# Patient Record
Sex: Male | Born: 1970 | Race: White | Hispanic: No | Marital: Single | Smoking: Never smoker
Health system: Southern US, Community
[De-identification: ages and names within clinical notes are randomized; demographics above are authoritative.]

---

## 1992-10-05 HISTORY — PX: ANKLE SURGERY: SHX546

## 2009-10-05 HISTORY — PX: KNEE SURGERY: SHX244

## 2012-09-06 ENCOUNTER — Encounter: Payer: Self-pay | Admitting: Cardiovascular Disease

## 2012-09-06 ENCOUNTER — Ambulatory Visit (INDEPENDENT_AMBULATORY_CARE_PROVIDER_SITE_OTHER): Payer: Self-pay | Admitting: Cardiovascular Disease

## 2012-09-06 VITALS — BP 102/64 | HR 49 | Ht 76.0 in | Wt 195.0 lb

## 2012-09-06 DIAGNOSIS — R002 Palpitations: Secondary | ICD-10-CM | POA: Insufficient documentation

## 2012-09-06 DIAGNOSIS — Z8249 Family history of ischemic heart disease and other diseases of the circulatory system: Secondary | ICD-10-CM

## 2012-09-06 NOTE — Patient Instructions (Addendum)
**Note De-identified Kaspian Muccio Obfuscation** Your physician recommends that you continue on your current medications as directed. Please refer to the Current Medication list given to you today.  Your physician wants you to follow-up in: 2 years  You will receive a reminder letter in the mail two months in advance. If you don't receive a letter, please call our office to schedule the follow-up appointment.  

## 2012-09-06 NOTE — Progress Notes (Signed)
HPI:  41 year old gentleman presenting for initial cardiac evaluation. The patient has had a few episodes of palpitations, but no sustained symptoms. He has noticed this when he's been engaged in exercise. He denies any episodes of lightheadedness, near syncope, or frank syncope. He's had no chest pain or pressure, dyspnea, edema, orthopnea, or PND. He has been in very good health over the years and was a Optometrist in college. He doesn't exercise as much as in the past because of orthopedic problems. He stays active with his teenage boys.  He has a family history of premature CAD on his father's side of the family. His paternal grandfather and paternal uncles all had coronary disease in their 38s and 57s. His mother side of the family has had atrial fibrillation.  The patient has no personal history of hypertension, diabetes, dyslipidemia, or tobacco use.  No outpatient encounter prescriptions on file as of 09/06/2012.    Review of patient's allergies indicates not on file.  History reviewed. No pertinent past medical history.  Past Surgical History  Procedure Date  . Ankle surgery 1994     On both Ankles due sports activities  . Knee surgery 2011    Left knee surgery due to sports activities    History   Social History  . Marital Status: Single    Spouse Name: N/A    Number of Children: N/A  . Years of Education: N/A   Occupational History  . Not on file.   Social History Main Topics  . Smoking status: Never Smoker   . Smokeless tobacco: Not on file  . Alcohol Use: 0.6 oz/week    1 Glasses of wine per week  . Drug Use: No  . Sexually Active: Yes   Other Topics Concern  . Not on file   Social History Narrative  . No narrative on file    Family History  Problem Relation Age of Onset  . Atrial fibrillation Mother   . Lung cancer Father   . Hyperlipidemia Brother   . Hypertension Maternal Aunt   . Hypertension Maternal Uncle   . Heart disease Paternal  Uncle   . Heart attack Paternal Grandfather     ROS:  General: no fevers/chills/night sweats Eyes: no blurry vision, diplopia, or amaurosis ENT: no sore throat or hearing loss Resp: no cough, wheezing, or hemoptysis CV: no edema or palpitations GI: no abdominal pain, nausea, vomiting, diarrhea, or constipation GU: no dysuria, frequency, or hematuria Skin: no rash Neuro: no headache, numbness, tingling, or weakness of extremities Musculoskeletal: Positive for chronic left knee pain Heme: no bleeding, DVT, or easy bruising Endo: no polydipsia or polyuria  BP 102/64  Pulse 49  Ht 6\' 4"  (1.93 m)  Wt 88.451 kg (195 lb)  BMI 23.74 kg/m2  SpO2 98%  PHYSICAL EXAM: Pt is alert and oriented, WD, WN, in no distress. HEENT: normal Neck: JVP normal. Carotid upstrokes normal without bruits. No thyromegaly. Lungs: equal expansion, clear bilaterally CV: Apex is discrete and nondisplaced, RRR without murmur or gallop Abd: soft, NT, +BS, no bruit, no hepatosplenomegaly Back: no CVA tenderness Ext: no C/C/E        DP/PT pulses intact and = Skin: warm and dry without rash Neuro: CNII-XII intact             Strength intact = bilaterally  EKG:  Marked sinus bradycardia 49 beats per minute, otherwise within normal limits.  ASSESSMENT AND PLAN: 1. Palpitations. I don't see any concerning findings  here. The patient's resting bradycardia is likely related to high vagal tone from good physical condition. He's had rare episodes of palpitations and will notify me if they increase in frequency or severity. His physical exam is normal and there is no suggestion of structural heart disease.  2. Family history of CAD. There are no other associated risk factors present. A cholesterol panel will be done in the next few months by Dr. Waynard Edwards. I'll request a copy of those labs as well. The patient informs me that his cholesterol panel has been excellent in the past. We discussed consideration of screening for  subclinical atherosclerosis, but will defer for now.  For followup I would like to see Ulysees back in about 2 years unless problems arise in the interim.  Tonny Bollman 09/06/2012 2:13 PM

## 2012-09-27 NOTE — Addendum Note (Signed)
Addended by: Burnett Kanaris A on: 09/27/2012 11:20 AM   Modules accepted: Orders

## 2016-01-22 DIAGNOSIS — Z23 Encounter for immunization: Secondary | ICD-10-CM | POA: Diagnosis not present

## 2016-09-29 DIAGNOSIS — R05 Cough: Secondary | ICD-10-CM | POA: Diagnosis not present

## 2017-10-27 DIAGNOSIS — H52223 Regular astigmatism, bilateral: Secondary | ICD-10-CM | POA: Diagnosis not present

## 2017-12-22 DIAGNOSIS — Z0000 Encounter for general adult medical examination without abnormal findings: Secondary | ICD-10-CM | POA: Diagnosis not present

## 2017-12-22 DIAGNOSIS — Z125 Encounter for screening for malignant neoplasm of prostate: Secondary | ICD-10-CM | POA: Diagnosis not present

## 2017-12-24 DIAGNOSIS — Z0000 Encounter for general adult medical examination without abnormal findings: Secondary | ICD-10-CM | POA: Diagnosis not present

## 2017-12-24 DIAGNOSIS — M546 Pain in thoracic spine: Secondary | ICD-10-CM | POA: Diagnosis not present

## 2017-12-24 DIAGNOSIS — R002 Palpitations: Secondary | ICD-10-CM | POA: Diagnosis not present

## 2017-12-24 DIAGNOSIS — Z1389 Encounter for screening for other disorder: Secondary | ICD-10-CM | POA: Diagnosis not present

## 2017-12-24 DIAGNOSIS — I4510 Unspecified right bundle-branch block: Secondary | ICD-10-CM | POA: Diagnosis not present

## 2017-12-24 DIAGNOSIS — Z23 Encounter for immunization: Secondary | ICD-10-CM | POA: Diagnosis not present

## 2018-02-08 DIAGNOSIS — M25511 Pain in right shoulder: Secondary | ICD-10-CM | POA: Diagnosis not present

## 2018-08-03 DIAGNOSIS — M79671 Pain in right foot: Secondary | ICD-10-CM | POA: Diagnosis not present

## 2018-08-03 DIAGNOSIS — M21611 Bunion of right foot: Secondary | ICD-10-CM | POA: Diagnosis not present

## 2019-03-21 DIAGNOSIS — M479 Spondylosis, unspecified: Secondary | ICD-10-CM | POA: Diagnosis not present

## 2019-03-21 DIAGNOSIS — M545 Low back pain: Secondary | ICD-10-CM | POA: Diagnosis not present

## 2019-06-06 DIAGNOSIS — M546 Pain in thoracic spine: Secondary | ICD-10-CM | POA: Diagnosis not present

## 2019-06-15 DIAGNOSIS — M546 Pain in thoracic spine: Secondary | ICD-10-CM | POA: Diagnosis not present

## 2019-06-23 DIAGNOSIS — M546 Pain in thoracic spine: Secondary | ICD-10-CM | POA: Diagnosis not present

## 2019-06-30 DIAGNOSIS — M546 Pain in thoracic spine: Secondary | ICD-10-CM | POA: Diagnosis not present

## 2019-07-11 DIAGNOSIS — M546 Pain in thoracic spine: Secondary | ICD-10-CM | POA: Diagnosis not present

## 2019-11-21 DIAGNOSIS — Z03818 Encounter for observation for suspected exposure to other biological agents ruled out: Secondary | ICD-10-CM | POA: Diagnosis not present

## 2019-11-21 DIAGNOSIS — Z20828 Contact with and (suspected) exposure to other viral communicable diseases: Secondary | ICD-10-CM | POA: Diagnosis not present

## 2019-12-28 ENCOUNTER — Ambulatory Visit: Payer: Self-pay | Attending: Internal Medicine

## 2019-12-28 DIAGNOSIS — Z23 Encounter for immunization: Secondary | ICD-10-CM

## 2019-12-28 NOTE — Progress Notes (Signed)
   Covid-19 Vaccination Clinic  Name:  Geoffrey Sullivan    MRN: [id number] DOB: 19-Dec-1970  12/28/2019  [name] was observed post Covid-19 immunization for 15 minutes without incident. He was provided with Vaccine Information Sheet and instruction to access the V-Safe system.   [name] was instructed to call 911 with any severe reactions post vaccine: Marland Kitchen Difficulty breathing  . Swelling of face and throat  . A fast heartbeat  . A bad rash all over body  . Dizziness and weakness   Immunizations Administered    Name Date Dose VIS Date Route   Pfizer COVID-19 Vaccine 12/28/2019  1:36 PM 0.3 mL 09/15/2019 Intramuscular   Manufacturer: [employer], [employer]   Lot: [id number]   NDC: [address]-6

## 2020-01-23 ENCOUNTER — Ambulatory Visit: Payer: Self-pay | Attending: Internal Medicine

## 2020-01-23 DIAGNOSIS — Z23 Encounter for immunization: Secondary | ICD-10-CM

## 2020-01-23 NOTE — Progress Notes (Signed)
   Covid-19 Vaccination Clinic  Name:  FIRMAN PETROW    MRN: [id number] DOB: 1971/05/30  01/23/2020  Mr. Kudrna was observed post Covid-19 immunization for 15 minutes without incident. He was provided with Vaccine Information Sheet and instruction to access the V-Safe system.   Mr. Gunnoe was instructed to call 911 with any severe reactions post vaccine: Marland Kitchen Difficulty breathing  . Swelling of face and throat  . A fast heartbeat  . A bad rash all over body  . Dizziness and weakness   Immunizations Administered    Name Date Dose VIS Date Route   Pfizer COVID-19 Vaccine 01/23/2020 10:04 AM 0.3 mL 11/29/2018 Intramuscular   Manufacturer: [employer], [employer]   Lot: [id number]   NDC: [address]-1

## 2020-01-24 ENCOUNTER — Ambulatory Visit: Payer: Self-pay

## 2020-05-28 DIAGNOSIS — Z0000 Encounter for general adult medical examination without abnormal findings: Secondary | ICD-10-CM | POA: Diagnosis not present

## 2020-05-28 DIAGNOSIS — Z125 Encounter for screening for malignant neoplasm of prostate: Secondary | ICD-10-CM | POA: Diagnosis not present

## 2020-05-29 DIAGNOSIS — Z0000 Encounter for general adult medical examination without abnormal findings: Secondary | ICD-10-CM | POA: Diagnosis not present

## 2020-05-29 DIAGNOSIS — E785 Hyperlipidemia, unspecified: Secondary | ICD-10-CM | POA: Diagnosis not present

## 2020-05-29 DIAGNOSIS — M546 Pain in thoracic spine: Secondary | ICD-10-CM | POA: Diagnosis not present

## 2020-05-29 DIAGNOSIS — Z23 Encounter for immunization: Secondary | ICD-10-CM | POA: Diagnosis not present

## 2020-06-11 DIAGNOSIS — Z20822 Contact with and (suspected) exposure to covid-19: Secondary | ICD-10-CM | POA: Diagnosis not present

## 2021-05-22 DIAGNOSIS — M25562 Pain in left knee: Secondary | ICD-10-CM | POA: Diagnosis not present

## 2021-05-22 DIAGNOSIS — M25561 Pain in right knee: Secondary | ICD-10-CM | POA: Diagnosis not present

## 2021-11-27 DIAGNOSIS — M79671 Pain in right foot: Secondary | ICD-10-CM | POA: Diagnosis not present

## 2021-12-23 DIAGNOSIS — E785 Hyperlipidemia, unspecified: Secondary | ICD-10-CM | POA: Diagnosis not present

## 2021-12-23 DIAGNOSIS — Z125 Encounter for screening for malignant neoplasm of prostate: Secondary | ICD-10-CM | POA: Diagnosis not present

## 2021-12-30 DIAGNOSIS — Z1331 Encounter for screening for depression: Secondary | ICD-10-CM | POA: Diagnosis not present

## 2021-12-30 DIAGNOSIS — Z1212 Encounter for screening for malignant neoplasm of rectum: Secondary | ICD-10-CM | POA: Diagnosis not present

## 2021-12-30 DIAGNOSIS — R82998 Other abnormal findings in urine: Secondary | ICD-10-CM | POA: Diagnosis not present

## 2021-12-30 DIAGNOSIS — Z0000 Encounter for general adult medical examination without abnormal findings: Secondary | ICD-10-CM | POA: Diagnosis not present

## 2021-12-30 DIAGNOSIS — Z1389 Encounter for screening for other disorder: Secondary | ICD-10-CM | POA: Diagnosis not present

## 2021-12-31 ENCOUNTER — Other Ambulatory Visit: Payer: Self-pay | Admitting: Internal Medicine

## 2021-12-31 DIAGNOSIS — E785 Hyperlipidemia, unspecified: Secondary | ICD-10-CM

## 2022-02-18 ENCOUNTER — Ambulatory Visit
Admission: RE | Admit: 2022-02-18 | Discharge: 2022-02-18 | Disposition: A | Discharge status: Home / Self Care | Payer: Self-pay | Source: Ambulatory Visit | Attending: Internal Medicine | Admitting: Internal Medicine

## 2022-02-18 DIAGNOSIS — E785 Hyperlipidemia, unspecified: Secondary | ICD-10-CM

## 2022-07-13 DIAGNOSIS — D2262 Melanocytic nevi of left upper limb, including shoulder: Secondary | ICD-10-CM | POA: Diagnosis not present

## 2022-07-13 DIAGNOSIS — D2261 Melanocytic nevi of right upper limb, including shoulder: Secondary | ICD-10-CM | POA: Diagnosis not present

## 2022-07-13 DIAGNOSIS — D225 Melanocytic nevi of trunk: Secondary | ICD-10-CM | POA: Diagnosis not present

## 2022-07-13 DIAGNOSIS — D2271 Melanocytic nevi of right lower limb, including hip: Secondary | ICD-10-CM | POA: Diagnosis not present

## 2023-05-23 IMAGING — CT CT CARDIAC CORONARY ARTERY CALCIUM SCORE
3 series · 12 of 20 positions shown, 14 images · non-contrast
Comparison: None Available.

CLINICAL DATA: Hyperlipidemia

EXAM:
CT CARDIAC CORONARY ARTERY CALCIUM SCORE
TECHNIQUE: Non-contrast imaging through the heart was performed using
prospective ECG gating. Image post processing was performed on an
independent workstation, allowing for quantitative analysis of the
heart and coronary arteries. Note that this exam targets the heart
and the chest was not imaged in its entirety.

[Series 2: calcium scoring 2.00 qr36 bestdiast 66% hrt calciu · axial · 0.42mm/px · z∈[+1545,+1585]mm · 2 of 100 slices shown]
[im 20/100  vessel]
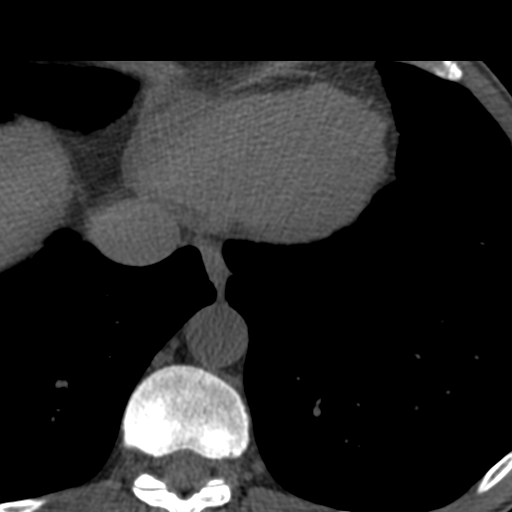
[im 40/100  vessel]
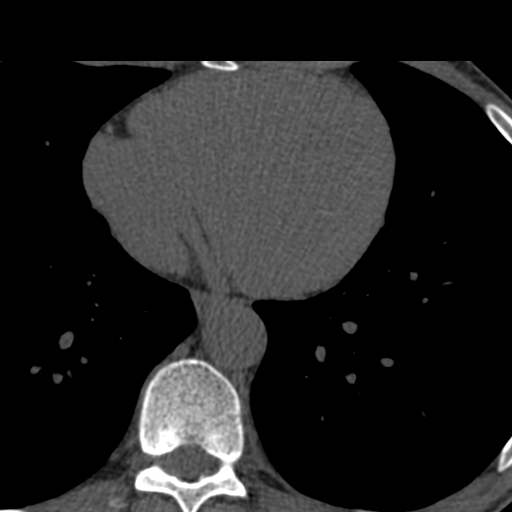

[Series 3: calcium scoring 2.00 br40 bestdiast 66% axial · axial · 0.53mm/px · z∈[+1541,+1671]mm · 5 of 99 slices shown, 7 images]
[im 17/99  vessel]
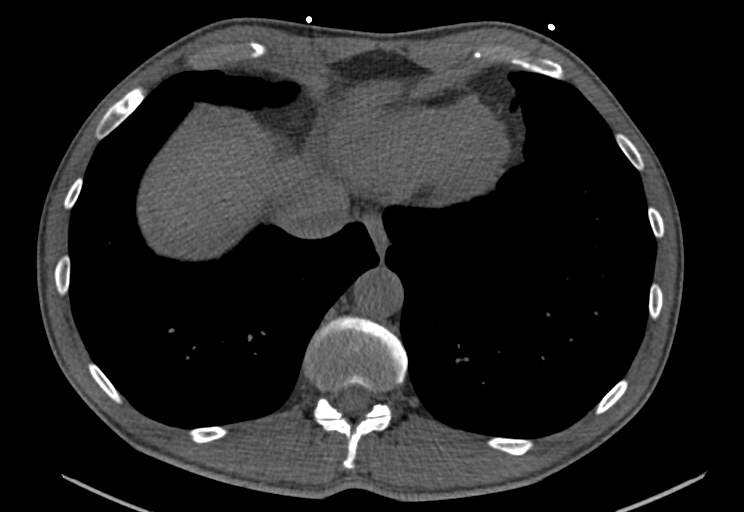
[im 17/99  lung]
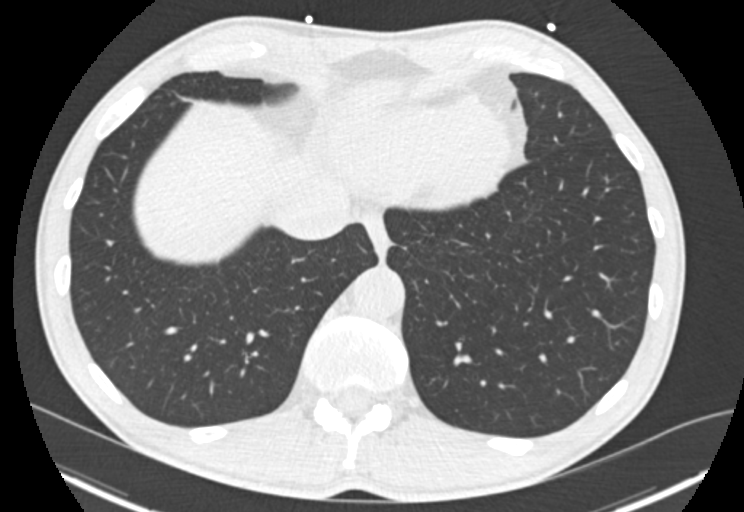
[im 33/99  vessel]
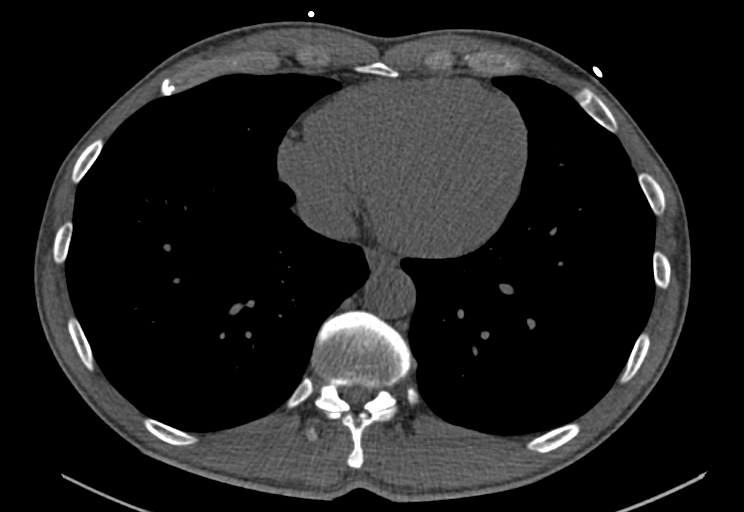
[im 50/99  vessel]
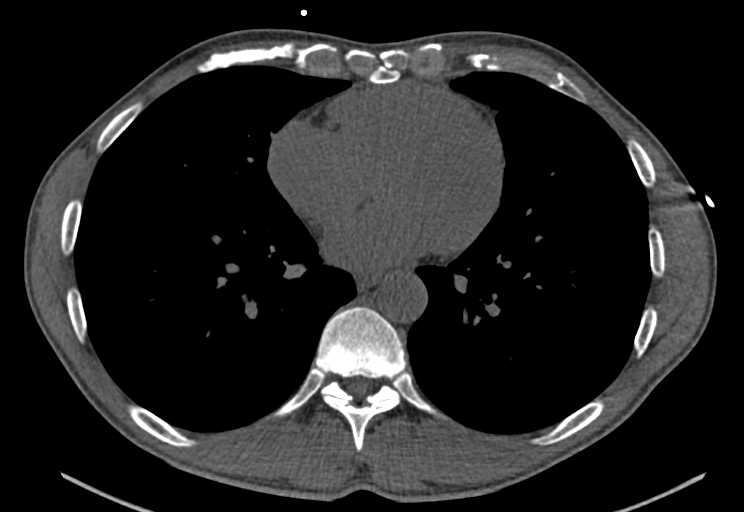
[im 66/99  vessel]
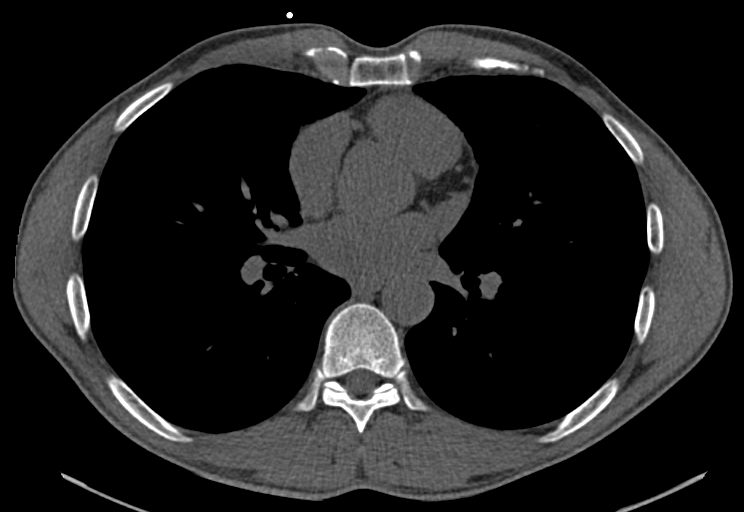
[im 82/99  vessel]
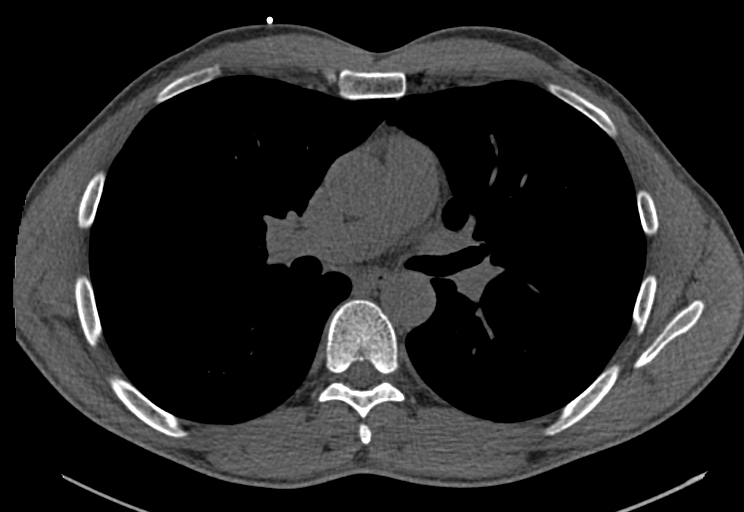
[im 82/99  lung]
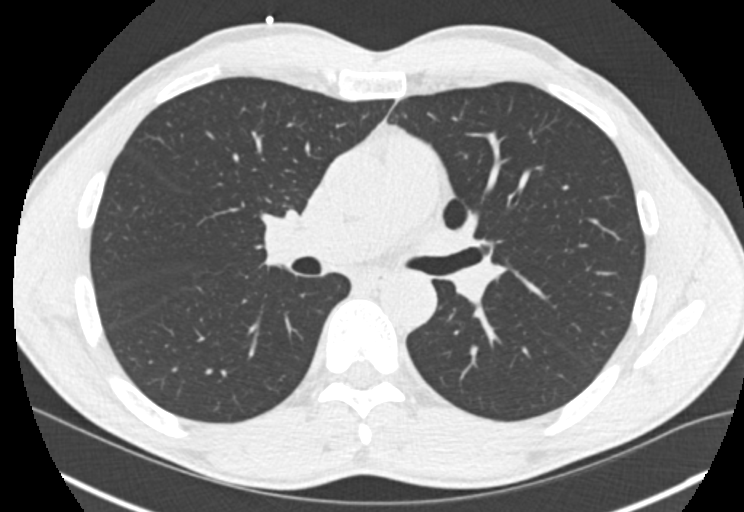

[Series 9: calcium scoring 2.00 br60 bestdiast 66% lungs · axial · 0.53mm/px · z∈[+1541,+1671]mm · 5 of 99 slices shown]
[im 17/99  vessel]
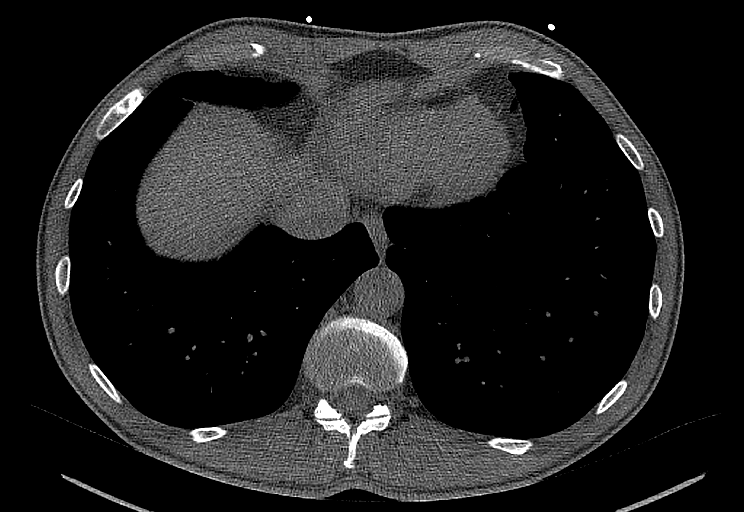
[im 33/99  vessel]
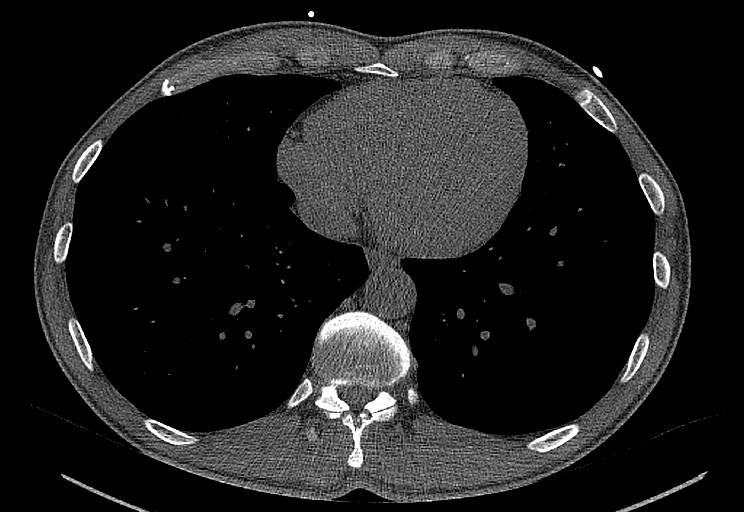
[im 50/99  vessel]
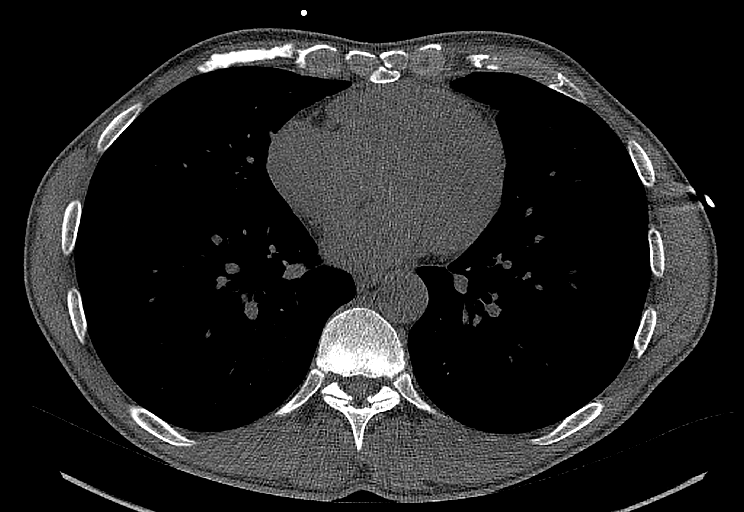
[im 66/99  vessel]
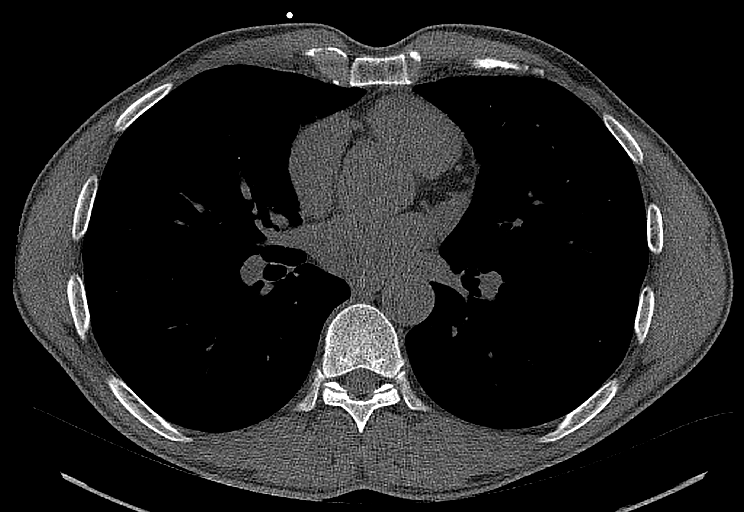
[im 82/99  vessel]
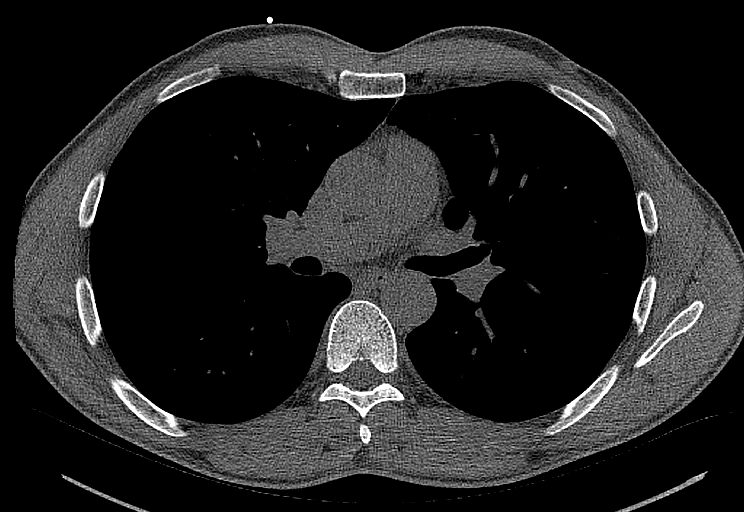

[12 of 20 positions shown; findings below may reference images not displayed]

FINDINGS: CORONARY CALCIUM SCORES:

Left Main: 0

LAD: 0

LCx: 0

RCA: 0

Total Agatston Score: 0

[HOSPITAL] percentile: 0

AORTA MEASUREMENTS:

Ascending Aorta: 43 mm

Descending Aorta: 31 mm

OTHER FINDINGS:

Heart is normal size. Aneurysmal dilatation of the aortic root at
the sinuses of Valsalva measuring 4.3 cm. No adenopathy. No
confluent airspace opacities or effusions. No acute findings in the
upper abdomen. Chest wall soft tissues are unremarkable. No acute
bony abnormality.
IMPRESSION: No visible coronary artery calcifications. Total coronary calcium
score of 0.

4.3 cm ascending thoracic aortic aneurysm. Recommend annual imaging
followup by CTA or MRA. This recommendation follows 4888
ACCF/AHA/AATS/ACR/ASA/SCA/SANEHA/ANKINE/LAAOUINA/ALINCHANOVNA Guidelines for the
Diagnosis and Management of Patients with Thoracic Aortic Disease.
Circulation. 4888; 121: E266-e369. Aortic aneurysm NOS ([id number])

## 2023-06-23 DIAGNOSIS — Z125 Encounter for screening for malignant neoplasm of prostate: Secondary | ICD-10-CM | POA: Diagnosis not present

## 2023-06-23 DIAGNOSIS — E785 Hyperlipidemia, unspecified: Secondary | ICD-10-CM | POA: Diagnosis not present

## 2023-07-01 DIAGNOSIS — M25512 Pain in left shoulder: Secondary | ICD-10-CM | POA: Diagnosis not present

## 2023-07-06 DIAGNOSIS — Z23 Encounter for immunization: Secondary | ICD-10-CM | POA: Diagnosis not present

## 2023-07-06 DIAGNOSIS — Z0000 Encounter for general adult medical examination without abnormal findings: Secondary | ICD-10-CM | POA: Diagnosis not present

## 2023-07-06 DIAGNOSIS — R82998 Other abnormal findings in urine: Secondary | ICD-10-CM | POA: Diagnosis not present

## 2023-07-08 ENCOUNTER — Other Ambulatory Visit: Payer: Self-pay | Admitting: Internal Medicine

## 2023-07-08 DIAGNOSIS — R945 Abnormal results of liver function studies: Secondary | ICD-10-CM

## 2023-07-15 DIAGNOSIS — M25512 Pain in left shoulder: Secondary | ICD-10-CM | POA: Diagnosis not present

## 2023-07-26 ENCOUNTER — Other Ambulatory Visit: Payer: No Typology Code available for payment source

## 2023-07-26 DIAGNOSIS — M7741 Metatarsalgia, right foot: Secondary | ICD-10-CM | POA: Diagnosis not present

## 2023-07-28 ENCOUNTER — Ambulatory Visit
Admission: RE | Admit: 2023-07-28 | Discharge: 2023-07-28 | Disposition: A | Discharge status: Home / Self Care | Payer: BC Managed Care – PPO | Source: Ambulatory Visit | Attending: Internal Medicine | Admitting: Internal Medicine

## 2023-07-28 DIAGNOSIS — R945 Abnormal results of liver function studies: Secondary | ICD-10-CM | POA: Diagnosis not present

## 2023-08-04 DIAGNOSIS — M25512 Pain in left shoulder: Secondary | ICD-10-CM | POA: Diagnosis not present

## 2023-08-17 DIAGNOSIS — Z1211 Encounter for screening for malignant neoplasm of colon: Secondary | ICD-10-CM | POA: Diagnosis not present

## 2023-08-19 DIAGNOSIS — M25512 Pain in left shoulder: Secondary | ICD-10-CM | POA: Diagnosis not present

## 2023-08-31 ENCOUNTER — Other Ambulatory Visit: Payer: Self-pay | Admitting: Internal Medicine

## 2023-08-31 DIAGNOSIS — I77810 Thoracic aortic ectasia: Secondary | ICD-10-CM

## 2023-09-24 ENCOUNTER — Ambulatory Visit
Admission: RE | Admit: 2023-09-24 | Discharge: 2023-09-24 | Disposition: A | Discharge status: Home / Self Care | Payer: BC Managed Care – PPO | Source: Ambulatory Visit | Attending: Internal Medicine | Admitting: Internal Medicine

## 2023-09-24 DIAGNOSIS — I77810 Thoracic aortic ectasia: Secondary | ICD-10-CM

## 2023-09-24 DIAGNOSIS — I719 Aortic aneurysm of unspecified site, without rupture: Secondary | ICD-10-CM | POA: Diagnosis not present

## 2023-09-24 MED ORDER — IOPAMIDOL (ISOVUE-370) INJECTION 76%
100.0000 mL | Freq: Once | INTRAVENOUS | Status: AC | PRN
  Administered 2023-09-24: 100 mL via INTRAVENOUS

## 2023-10-16 DIAGNOSIS — M79671 Pain in right foot: Secondary | ICD-10-CM | POA: Diagnosis not present

## 2023-10-27 DIAGNOSIS — M21611 Bunion of right foot: Secondary | ICD-10-CM | POA: Diagnosis not present

## 2024-08-15 DIAGNOSIS — M79605 Pain in left leg: Secondary | ICD-10-CM | POA: Diagnosis not present

## 2024-08-15 DIAGNOSIS — R269 Unspecified abnormalities of gait and mobility: Secondary | ICD-10-CM | POA: Diagnosis not present

## 2024-08-22 DIAGNOSIS — R269 Unspecified abnormalities of gait and mobility: Secondary | ICD-10-CM | POA: Diagnosis not present

## 2024-08-22 DIAGNOSIS — M79605 Pain in left leg: Secondary | ICD-10-CM | POA: Diagnosis not present

## 2024-09-07 DIAGNOSIS — M79605 Pain in left leg: Secondary | ICD-10-CM | POA: Diagnosis not present

## 2024-09-07 DIAGNOSIS — R269 Unspecified abnormalities of gait and mobility: Secondary | ICD-10-CM | POA: Diagnosis not present
